# Patient Record
Sex: Female | Born: 2000 | Race: Black or African American | Hispanic: No | Marital: Single | Smoking: Never smoker
Health system: Southern US, Community
[De-identification: ages and names within clinical notes are randomized; demographics above are authoritative.]

---

## 2019-07-20 ENCOUNTER — Other Ambulatory Visit: Payer: Self-pay

## 2019-07-20 DIAGNOSIS — Z20822 Contact with and (suspected) exposure to covid-19: Secondary | ICD-10-CM

## 2019-07-21 LAB — NOVEL CORONAVIRUS, NAA: SARS-CoV-2, NAA: NOT DETECTED

## 2019-08-01 ENCOUNTER — Ambulatory Visit (INDEPENDENT_AMBULATORY_CARE_PROVIDER_SITE_OTHER): Payer: 59

## 2019-08-01 ENCOUNTER — Ambulatory Visit (HOSPITAL_COMMUNITY)
Admission: EM | Admit: 2019-08-01 | Discharge: 2019-08-01 | Disposition: A | Discharge status: Home / Self Care | Payer: 59 | Attending: Family Medicine | Admitting: Family Medicine

## 2019-08-01 ENCOUNTER — Other Ambulatory Visit: Payer: Self-pay

## 2019-08-01 ENCOUNTER — Encounter (HOSPITAL_COMMUNITY): Payer: Self-pay

## 2019-08-01 DIAGNOSIS — R102 Pelvic and perineal pain: Secondary | ICD-10-CM

## 2019-08-01 DIAGNOSIS — N898 Other specified noninflammatory disorders of vagina: Secondary | ICD-10-CM

## 2019-08-01 LAB — POCT URINALYSIS DIP (DEVICE)
Bilirubin Urine: NEGATIVE
Glucose, UA: NEGATIVE mg/dL
Ketones, ur: NEGATIVE mg/dL
Leukocytes,Ua: NEGATIVE
Nitrite: NEGATIVE
Protein, ur: NEGATIVE mg/dL
Specific Gravity, Urine: >=1.03 (ref 1.005–1.030)
Urobilinogen, UA: 0.2 mg/dL (ref 0.0–1.0)
pH: 5.5 (ref 5.0–8.0)

## 2019-08-01 LAB — POCT PREGNANCY, URINE: Preg Test, Ur: NEGATIVE

## 2019-08-01 MED ORDER — CEFTRIAXONE SODIUM 250 MG IJ SOLR
250.0000 mg | Freq: Once | INTRAMUSCULAR | Status: AC
  Administered 2019-08-01: 250 mg via INTRAMUSCULAR

## 2019-08-01 MED ORDER — FLUCONAZOLE 150 MG PO TABS: 150.0000 mg | ORAL_TABLET | Freq: Every day | ORAL | 0 refills | Status: AC

## 2019-08-01 MED ORDER — CEFTRIAXONE SODIUM 250 MG IJ SOLR
INTRAMUSCULAR | Status: AC
  Filled 2019-08-01: qty 250

## 2019-08-01 MED ORDER — AZITHROMYCIN 250 MG PO TABS
1000.0000 mg | ORAL_TABLET | Freq: Once | ORAL | Status: AC
  Administered 2019-08-01: 15:00:00 1000 mg via ORAL

## 2019-08-01 MED ORDER — AZITHROMYCIN 250 MG PO TABS
ORAL_TABLET | ORAL | Status: AC
  Filled 2019-08-01: qty 4

## 2019-08-01 MED ORDER — DOXYCYCLINE HYCLATE 100 MG PO CAPS: 100.0000 mg | ORAL_CAPSULE | Freq: Two times a day (BID) | ORAL | 0 refills | Status: AC

## 2019-08-01 MED ORDER — ONDANSETRON 4 MG PO TBDP: 4.0000 mg | ORAL_TABLET | Freq: Three times a day (TID) | ORAL | 0 refills | Status: AC | PRN

## 2019-08-01 NOTE — Discharge Instructions (Addendum)
Treating you for pelvic infection.  Medication given here in clinic for gonorrhea and chlamydia.  Sending medication to the pharmacy for pelvic infection and yeast infection.  Zofran for nausea as needed.  You labs area pending and we will call you with any positive results. You can also check your my chart.

## 2019-08-01 NOTE — ED Triage Notes (Signed)
Patient presents to Urgent Care with complaints of lower abdominal pain, worse when she urinates since last week. Patient reports she has also had nausea and diarrhea occasionally after eating.

## 2019-08-01 NOTE — ED Provider Notes (Signed)
[facility]    CSN: [id number] Arrival date & time: 08/01/19  1306      History   Chief Complaint Chief Complaint  Patient presents with  . Abdominal Pain  . Nausea    HPI Ashby Liff is a 18 y.o. female.   Patient is a 18 year old female the presents today with intermittent nausea, lower abdominal discomfort, cramping and back pain.  Symptoms have been intermittent over the last couple days but worsening today. Mild diarrhea.  No fever.  She is currently sexually active with one partner, unprotected and protected.  She is currently on birth control.  Did not take her pill yesterday.  Denies any vaginal bleeding, discharge, itching or irritation.  No dysuria, hematuria or urinary frequency.  ROS per HPI      History reviewed. No pertinent past medical history.  There are no active problems to display for this patient.   History reviewed. No pertinent surgical history.  OB History   No obstetric history on file.      Home Medications    Prior to Admission medications   Medication Sig Start Date End Date Taking? Authorizing Provider  ALAYCEN 1/35 tablet  05/23/19  Yes [provider]  doxycycline (VIBRAMYCIN) 100 MG capsule Take 1 capsule (100 mg total) by mouth 2 (two) times daily for 7 days. 08/01/19 08/08/19  Dahlia Byes A, NP  fluconazole (DIFLUCAN) 150 MG tablet Take 1 tablet (150 mg total) by mouth daily. 08/01/19   Alec Mcphee, Gloris Manchester A, NP  ondansetron (ZOFRAN ODT) 4 MG disintegrating tablet Take 1 tablet (4 mg total) by mouth every 8 (eight) hours as needed for nausea or vomiting. 08/01/19   Janace Aris, NP    Family History Family History  Problem Relation Age of Onset  . Diabetes Mother   . Healthy Father     Social History Social History   Tobacco Use  . Smoking status: Never Smoker  . Smokeless tobacco: Never Used  Substance Use Topics  . Alcohol use: Yes    Comment: socially  . Drug use: Not on file     Allergies    Penicillins   Review of Systems Review of Systems   Physical Exam Triage Vital Signs ED Triage Vitals  Enc Vitals Group     BP 08/01/19 1322 112/65     Pulse Rate 08/01/19 1322 85     Resp 08/01/19 1322 15     Temp 08/01/19 1322 98.4 F (36.9 C)     Temp Source 08/01/19 1322 Oral     SpO2 08/01/19 1322 99 %     Weight --      Height --      Head Circumference --      Peak Flow --      Pain Score 08/01/19 1320 8     Pain Loc --      Pain Edu? --      Excl. in GC? --    No data found.  Updated Vital Signs BP 112/65 (BP Location: Left Arm)   Pulse 85   Temp 98.4 F (36.9 C) (Oral)   Resp 15   SpO2 99%   Visual Acuity Right Eye Distance:   Left Eye Distance:   Bilateral Distance:    Right Eye Near:   Left Eye Near:    Bilateral Near:     Physical Exam Vitals signs and nursing note reviewed.  Constitutional:      General: She is not in  acute distress.    Appearance: She is well-developed. She is not ill-appearing, toxic-appearing or diaphoretic.  HENT:     Head: Normocephalic and atraumatic.  Pulmonary:     Effort: Pulmonary effort is normal.  Abdominal:     Tenderness: There is abdominal tenderness in the right lower quadrant, suprapubic area and left lower quadrant. There is guarding. There is no right CVA tenderness, left CVA tenderness or rebound.  Genitourinary:    Vagina: No signs of injury. Vaginal discharge, erythema and tenderness present.     Cervix: Cervical motion tenderness, discharge and friability present.     Comments: External vaginal exam without lesions, swelling, erythema or tenderness. White discharge noted at vaginal opening.  Internal exam with thick/cottage cheese discharge Tender with insertion of the speculum.  Mild cervical friability.  Tenderness with bimanual exam And pelvic pain.   Skin:    General: Skin is warm and dry.  Neurological:     General: No focal deficit present.     Mental Status: She is alert.   Psychiatric:        Mood and Affect: Mood normal.      UC Treatments / Results  Labs (all labs ordered are listed, but only abnormal results are displayed) Labs Reviewed  POCT URINALYSIS DIP (DEVICE) - Abnormal; Notable for the following components:      Result Value   Hgb urine dipstick TRACE (*)    All other components within normal limits  POC URINE PREG, ED  POCT PREGNANCY, URINE  CERVICOVAGINAL ANCILLARY ONLY    EKG   Radiology Dg Abd 1 View  Result Date: 08/01/2019 CLINICAL DATA:  Lower abdominal pain for 3 days. EXAM: ABDOMEN - 1 VIEW COMPARISON:  None. FINDINGS: Lung bases are clear. Gas is demonstrated within nondilated loops of large and small bowel in a nonobstructed pattern. Left hemipelvis phleboliths. Osseous structures unremarkable. IMPRESSION: Nonobstructed bowel gas pattern. Electronically Signed   By: Annia Beltrew  Davis M.D.   On: 08/01/2019 14:25    Procedures Procedures (including critical care time)  Medications Ordered in UC Medications  cefTRIAXone (ROCEPHIN) injection 250 mg (has no administration in time range)  azithromycin (ZITHROMAX) tablet 1,000 mg (has no administration in time range)  azithromycin (ZITHROMAX) 250 MG tablet (has no administration in time range)  cefTRIAXone (ROCEPHIN) 250 MG injection (has no administration in time range)    Initial Impression / Assessment and Plan / UC Course  I have reviewed the triage vital signs and the nursing notes.  Pertinent labs & imaging results that were available during my care of the patient were reviewed by me and considered in my medical decision making (see chart for details).     Patient is a 18 year old female with pelvic discomfort, cervical motion tenderness and abnormal discharge on pelvic exam. Concern for PID Swab sent for testing Treating empirically for gonorrhea and chlamydia here today. Sending doxycycline to the pharmacy to treat for PID and Diflucan for yeast Zofran for nausea.   Urine with trace blood otherwise no UTI. Pregnancy test negative X ray normal- no constipation or bowel obstruction.   Recommended that if symptoms worsen over the next couple days while labs are pending or she starts developing any fever she need to go the ER. Pt understanding and agreed.   Final Clinical Impressions(s) / UC Diagnoses   Final diagnoses:  Pelvic pain in female  Vaginal discharge     Discharge Instructions     Treating you for pelvic infection.  Medication  given here in clinic for gonorrhea and chlamydia.  Sending medication to the pharmacy for pelvic infection and yeast infection.  Zofran for nausea as needed.  You labs area pending and we will call you with any positive results. You can also check your my chart.     ED Prescriptions    Medication Sig Dispense Auth. Provider   doxycycline (VIBRAMYCIN) 100 MG capsule Take 1 capsule (100 mg total) by mouth 2 (two) times daily for 7 days. 14 capsule Leonore Frankson A, NP   fluconazole (DIFLUCAN) 150 MG tablet Take 1 tablet (150 mg total) by mouth daily. 2 tablet Ronda Kazmi A, NP   ondansetron (ZOFRAN ODT) 4 MG disintegrating tablet Take 1 tablet (4 mg total) by mouth every 8 (eight) hours as needed for nausea or vomiting. 20 tablet Loura Halt A, NP     Controlled Substance Prescriptions [place] Controlled Substance Registry consulted? Not Applicable   Orvan July, NP 08/01/19 1450

## 2019-08-03 LAB — CERVICOVAGINAL ANCILLARY ONLY
Bacterial vaginitis: NEGATIVE
Candida vaginitis: NEGATIVE
Chlamydia: NEGATIVE
Neisseria Gonorrhea: NEGATIVE
Trichomonas: NEGATIVE

## 2020-01-20 IMAGING — DX DG ABDOMEN 1V
2 series · 2 of 2 positions shown · non-contrast
Comparison: None.

CLINICAL DATA: Lower abdominal pain for 3 days.

EXAM:
ABDOMEN - 1 VIEW

[abdomen kub (1 of 2)]
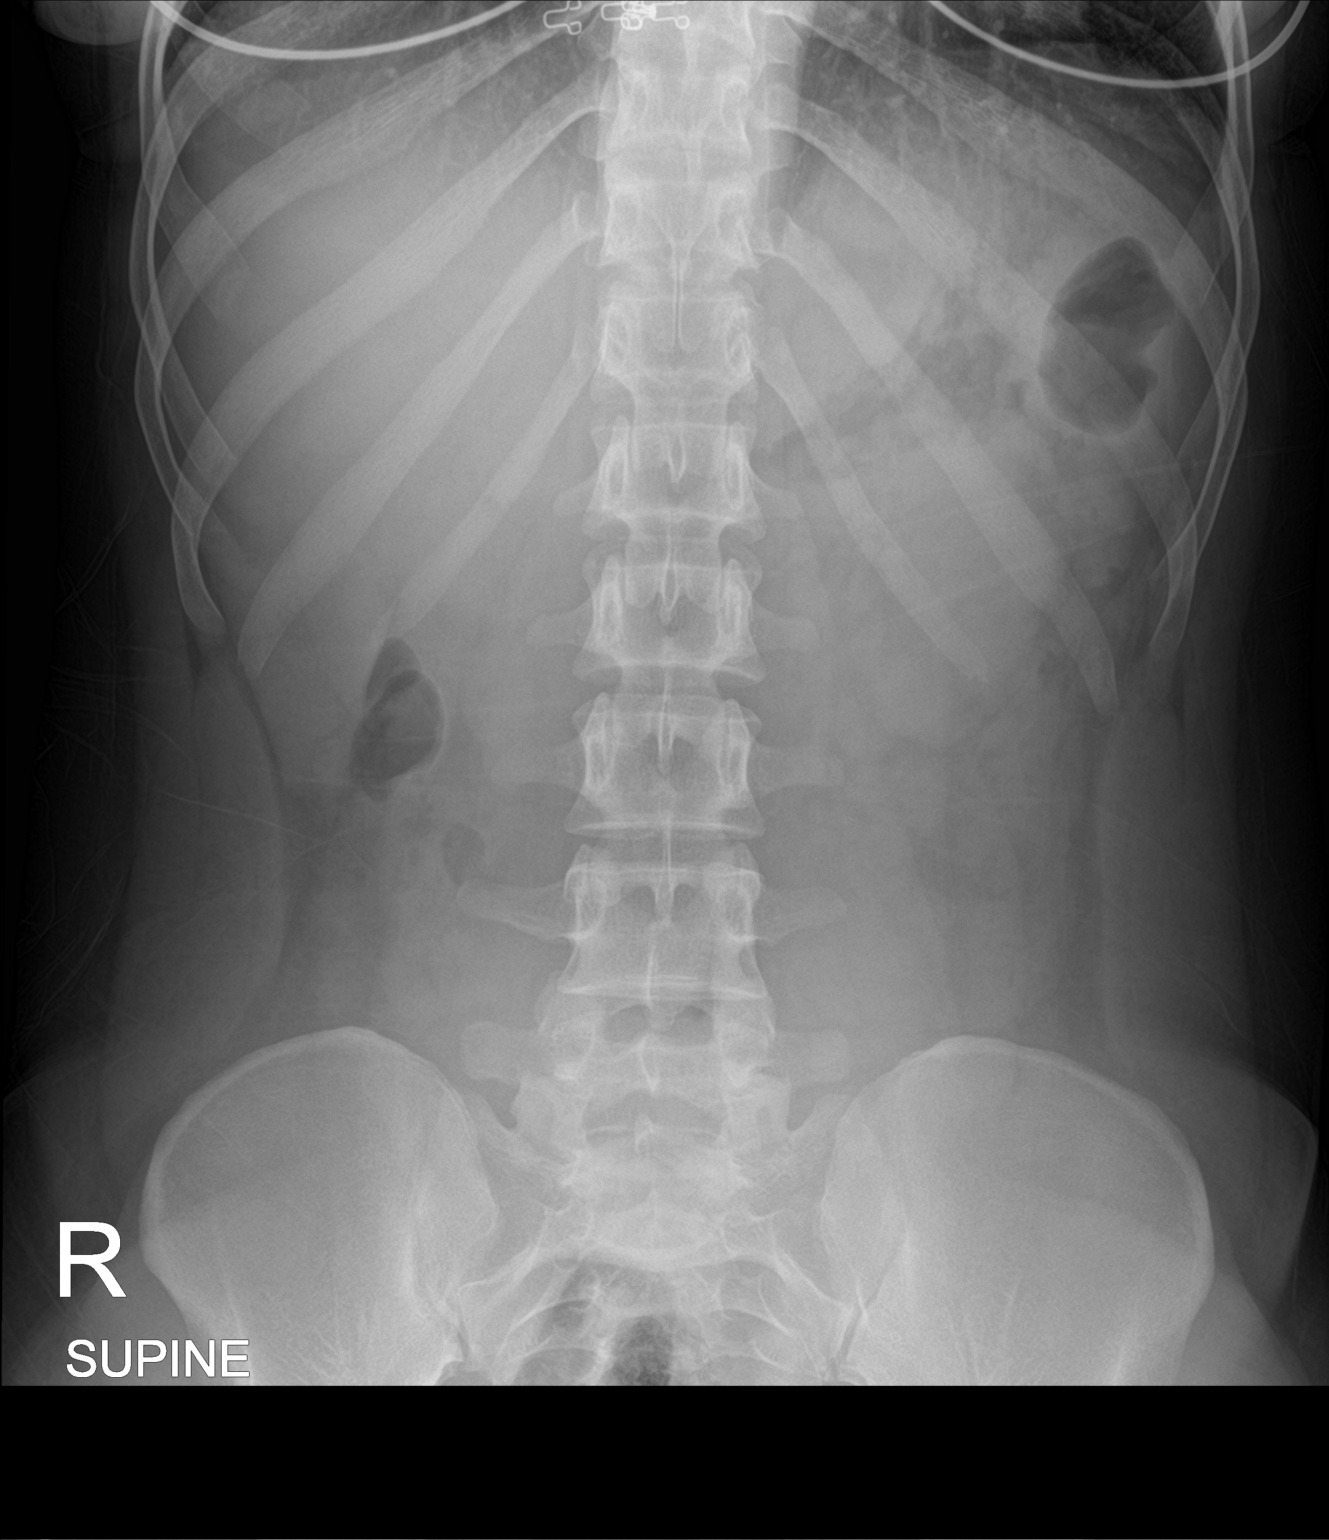

[abdomen kub (2 of 2)]
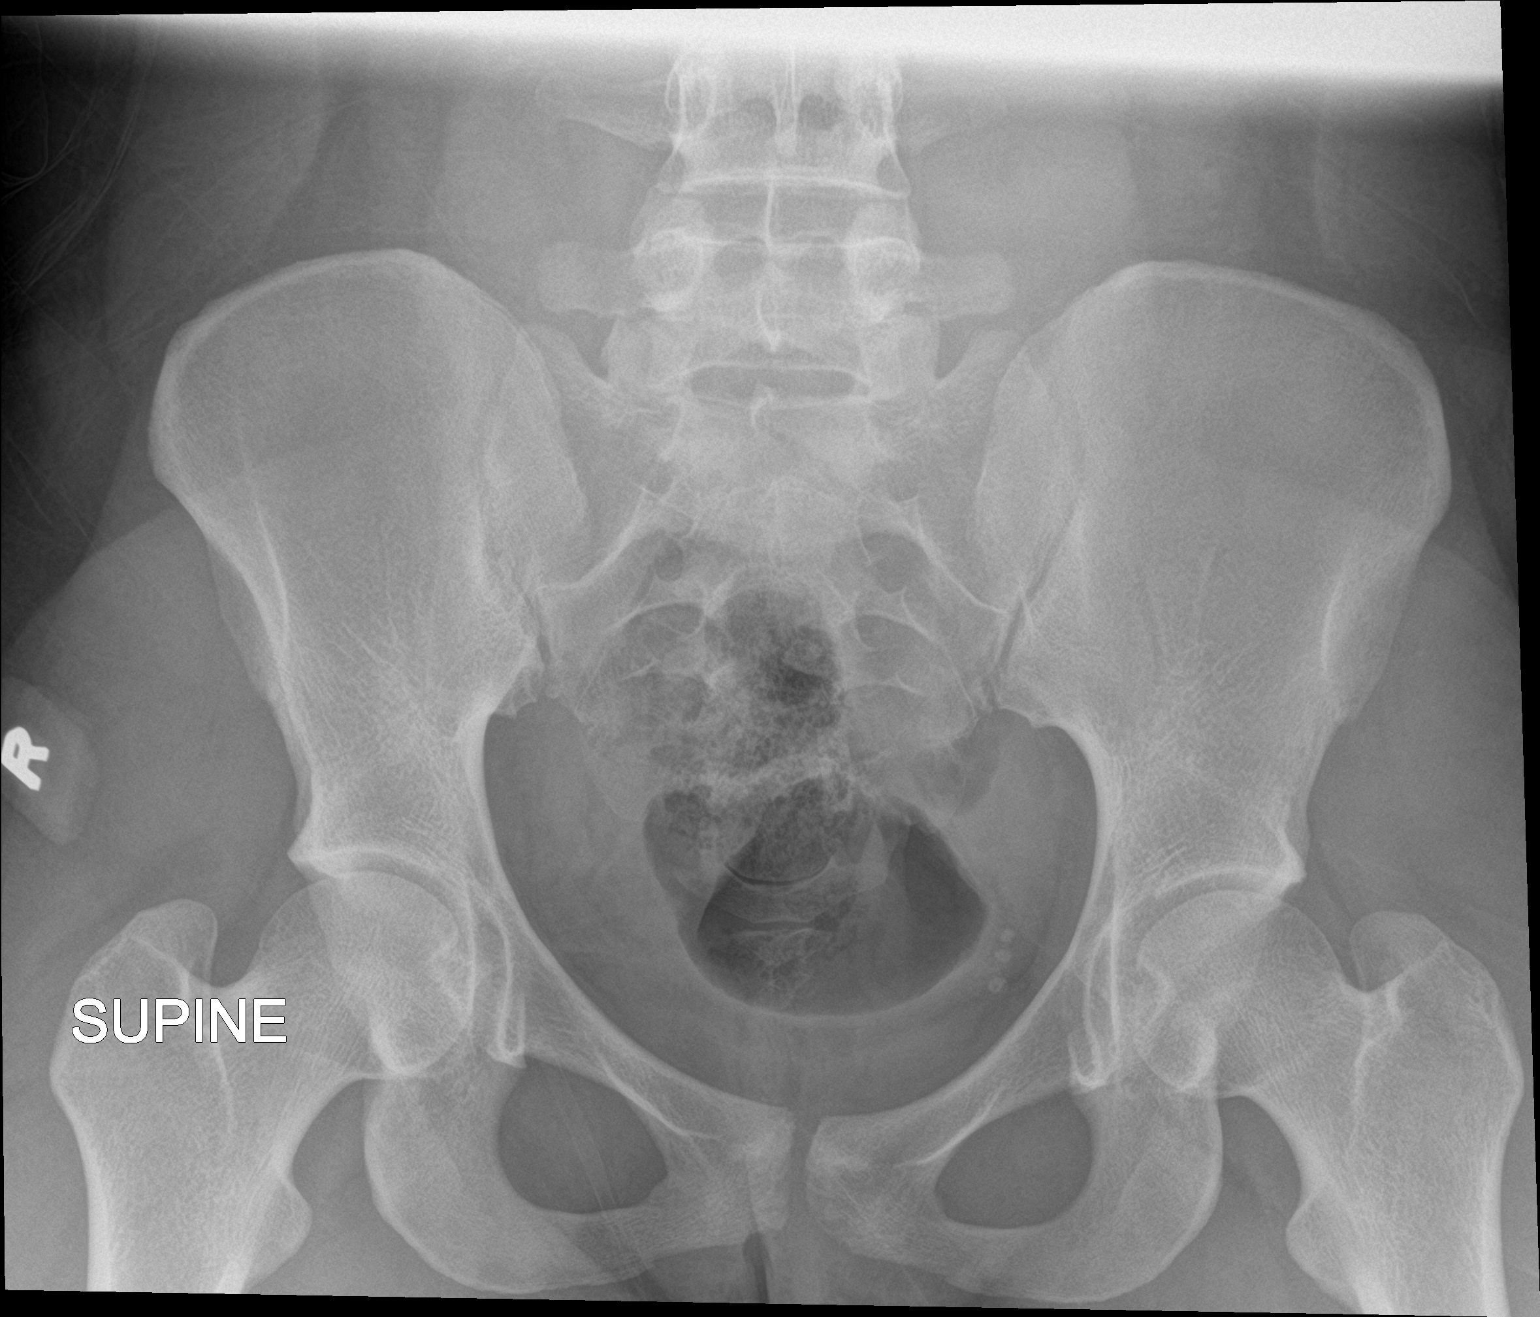

[2 of 2 positions shown; findings below may reference images not displayed]

FINDINGS: Lung bases are clear. Gas is demonstrated within nondilated loops of
large and small bowel in a nonobstructed pattern. Left hemipelvis
phleboliths. Osseous structures unremarkable.
IMPRESSION: Nonobstructed bowel gas pattern.

## 2020-02-20 ENCOUNTER — Ambulatory Visit: Payer: 59 | Attending: Family

## 2020-02-20 DIAGNOSIS — Z23 Encounter for immunization: Secondary | ICD-10-CM

## 2020-02-20 NOTE — Progress Notes (Signed)
   Covid-19 Vaccination Clinic  Name:  Terri Dorsey    MRN: [id number] DOB: 24-Aug-2001  02/20/2020  Ms. Harmon was observed post Covid-19 immunization for 15 minutes without incident. She was provided with Vaccine Information Sheet and instruction to access the V-Safe system.   Ms. Kaney was instructed to call 911 with any severe reactions post vaccine: Marland Kitchen Difficulty breathing  . Swelling of face and throat  . A fast heartbeat  . A bad rash all over body  . Dizziness and weakness   Immunizations Administered    Name Date Dose VIS Date Route   Moderna COVID-19 Vaccine 02/20/2020 11:56 AM 0.5 mL 10/22/2019 Intramuscular   Manufacturer: Moderna   Lot: [id number]   NDC: 70340-352-48

## 2020-03-24 ENCOUNTER — Ambulatory Visit: Payer: 59 | Attending: Family

## 2020-03-24 DIAGNOSIS — Z23 Encounter for immunization: Secondary | ICD-10-CM

## 2020-03-24 NOTE — Progress Notes (Signed)
   Covid-19 Vaccination Clinic  Name:  Brandee Markin    MRN: [id number] DOB: 2001-02-04  03/24/2020  Ms. Doffing was observed post Covid-19 immunization for 15 minutes without incident. She was provided with Vaccine Information Sheet and instruction to access the V-Safe system.   Ms. Robinette was instructed to call 911 with any severe reactions post vaccine: Marland Kitchen Difficulty breathing  . Swelling of face and throat  . A fast heartbeat  . A bad rash all over body  . Dizziness and weakness   Immunizations Administered    Name Date Dose VIS Date Route   Moderna COVID-19 Vaccine 03/24/2020 11:15 AM 0.5 mL 10/2019 Intramuscular   Manufacturer: Moderna   Lot: [id number]   NDC: 39767-341-93
